# Patient Record
Sex: Male | Born: 1940 | Race: White | Hispanic: No | Smoking: Former smoker
Health system: Southern US, Academic
[De-identification: ages and names within clinical notes are randomized; demographics above are authoritative.]

## PROBLEM LIST (undated history)

## (undated) DIAGNOSIS — M069 Rheumatoid arthritis, unspecified: Secondary | ICD-10-CM

## (undated) DIAGNOSIS — M5430 Sciatica, unspecified side: Secondary | ICD-10-CM

## (undated) DIAGNOSIS — I10 Essential (primary) hypertension: Secondary | ICD-10-CM

## (undated) HISTORY — DX: Sciatica, unspecified side: M54.30

## (undated) HISTORY — PX: LEG SURGERY: SHX1003

## (undated) HISTORY — PX: INSERT / REPLACE / REMOVE PACEMAKER: SHX2821

## (undated) HISTORY — PX: INJECT ANESTHETIC AGENT;GREAT OCCIPITAL NRV (AMB ONLY): 2100000923

## (undated) HISTORY — DX: Rheumatoid arthritis, unspecified (CMS HCC): M06.9

## (undated) HISTORY — PX: REPLACEMENT TOTAL KNEE: SUR1224

## (undated) HISTORY — DX: Essential (primary) hypertension: I10

## (undated) HISTORY — PX: HX BACK SURGERY: SHX140

---

## 2010-12-21 ENCOUNTER — Ambulatory Visit (HOSPITAL_COMMUNITY): Payer: Self-pay | Admitting: PULMONARY DISEASE

## 2011-01-17 ENCOUNTER — Other Ambulatory Visit: Payer: Self-pay

## 2011-07-25 ENCOUNTER — Other Ambulatory Visit: Payer: Self-pay

## 2011-07-29 LAB — HISTORICAL CYTOPATHOLOGY, NON GYN

## 2015-04-19 ENCOUNTER — Other Ambulatory Visit: Payer: Self-pay | Admitting: Orthopaedic Surgery

## 2015-04-19 DIAGNOSIS — M4800 Spinal stenosis, site unspecified: Secondary | ICD-10-CM

## 2015-04-22 ENCOUNTER — Ambulatory Visit
Admission: RE | Admit: 2015-04-22 | Discharge: 2015-04-22 | Disposition: A | Discharge status: Home / Self Care | Payer: Medicare HMO | Source: Ambulatory Visit | Attending: Orthopaedic Surgery | Admitting: Orthopaedic Surgery

## 2015-04-22 DIAGNOSIS — M4800 Spinal stenosis, site unspecified: Secondary | ICD-10-CM

## 2016-07-09 DIAGNOSIS — M5416 Radiculopathy, lumbar region: Secondary | ICD-10-CM | POA: Diagnosis not present

## 2016-07-09 DIAGNOSIS — Z5189 Encounter for other specified aftercare: Secondary | ICD-10-CM | POA: Diagnosis not present

## 2016-07-11 DIAGNOSIS — M5416 Radiculopathy, lumbar region: Secondary | ICD-10-CM | POA: Diagnosis not present

## 2016-07-11 DIAGNOSIS — Z5189 Encounter for other specified aftercare: Secondary | ICD-10-CM | POA: Diagnosis not present

## 2016-08-22 DIAGNOSIS — Z950 Presence of cardiac pacemaker: Secondary | ICD-10-CM | POA: Diagnosis not present

## 2016-10-23 DIAGNOSIS — Z79899 Other long term (current) drug therapy: Secondary | ICD-10-CM | POA: Diagnosis not present

## 2016-10-23 DIAGNOSIS — M545 Low back pain: Secondary | ICD-10-CM | POA: Diagnosis not present

## 2016-10-23 DIAGNOSIS — R7301 Impaired fasting glucose: Secondary | ICD-10-CM | POA: Diagnosis not present

## 2016-10-23 DIAGNOSIS — E785 Hyperlipidemia, unspecified: Secondary | ICD-10-CM | POA: Diagnosis not present

## 2016-10-23 DIAGNOSIS — I499 Cardiac arrhythmia, unspecified: Secondary | ICD-10-CM | POA: Diagnosis not present

## 2016-10-23 DIAGNOSIS — I10 Essential (primary) hypertension: Secondary | ICD-10-CM | POA: Diagnosis not present

## 2016-10-23 DIAGNOSIS — Z6832 Body mass index (BMI) 32.0-32.9, adult: Secondary | ICD-10-CM | POA: Diagnosis not present

## 2016-10-23 DIAGNOSIS — D2262 Melanocytic nevi of left upper limb, including shoulder: Secondary | ICD-10-CM | POA: Diagnosis not present

## 2016-10-23 DIAGNOSIS — N401 Enlarged prostate with lower urinary tract symptoms: Secondary | ICD-10-CM | POA: Diagnosis not present

## 2016-10-23 DIAGNOSIS — M2550 Pain in unspecified joint: Secondary | ICD-10-CM | POA: Diagnosis not present

## 2016-11-04 DIAGNOSIS — Z1212 Encounter for screening for malignant neoplasm of rectum: Secondary | ICD-10-CM | POA: Diagnosis not present

## 2016-11-04 DIAGNOSIS — Z1211 Encounter for screening for malignant neoplasm of colon: Secondary | ICD-10-CM | POA: Diagnosis not present

## 2016-11-22 DIAGNOSIS — Z950 Presence of cardiac pacemaker: Secondary | ICD-10-CM | POA: Diagnosis not present

## 2017-01-06 DIAGNOSIS — I4891 Unspecified atrial fibrillation: Secondary | ICD-10-CM | POA: Diagnosis not present

## 2017-01-06 DIAGNOSIS — Z950 Presence of cardiac pacemaker: Secondary | ICD-10-CM | POA: Diagnosis not present

## 2017-01-06 DIAGNOSIS — Z9289 Personal history of other medical treatment: Secondary | ICD-10-CM | POA: Diagnosis not present

## 2017-01-06 DIAGNOSIS — I495 Sick sinus syndrome: Secondary | ICD-10-CM | POA: Diagnosis not present

## 2017-01-14 IMAGING — MR MR LUMBAR SPINE W/O CM
4 of 5 series · 21 of 48 positions shown · non-contrast
Comparison: None.

CLINICAL DATA: Lower back pain radiating down both legs, pain
starts at hips and radiates to knees, pt having numbness in the left
leg, symptoms have been off and on for several years and are
continuing to worsen.

EXAM:
MRI LUMBAR SPINE WITHOUT CONTRAST
TECHNIQUE: Multiplanar, multisequence MR imaging of the lumbar spine was
performed. No intravenous contrast was administered.

[Series 6: T2 · sagittal · 4.0mm · 0.73mm/px · 8 of 18 slices shown (1 of 2)]
[im 1/18]
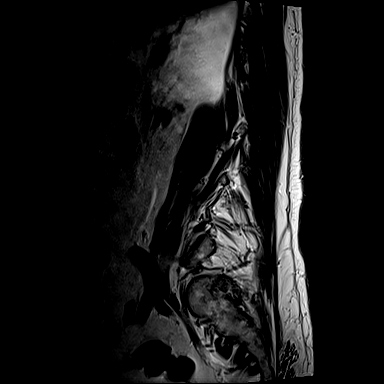
[im 3/18]
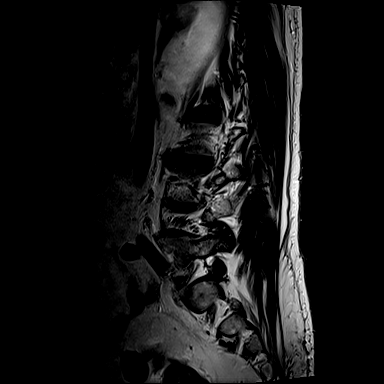
[im 5/18]
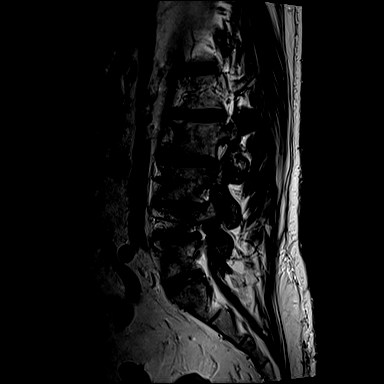
[im 8/18]
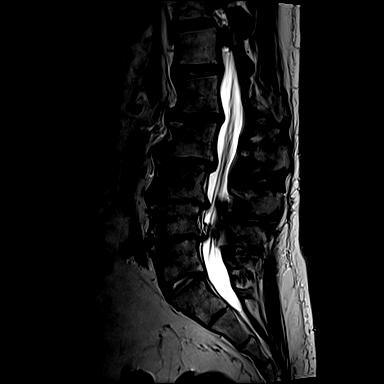
[im 10/18]
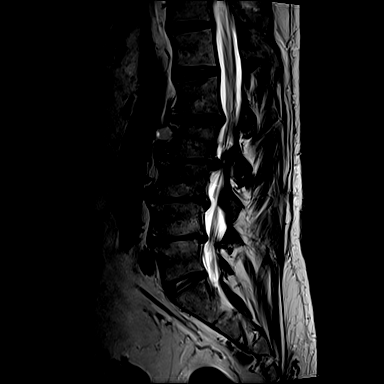
[im 13/18]
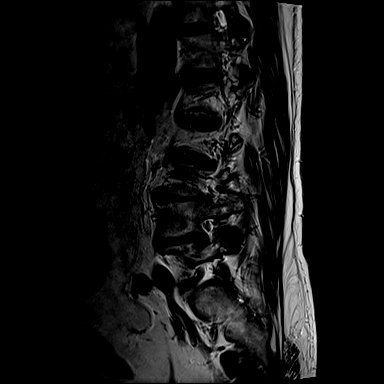
[im 15/18]
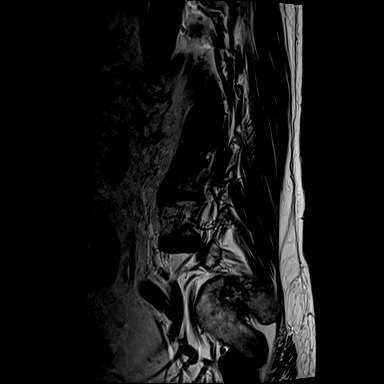
[im 18/18]
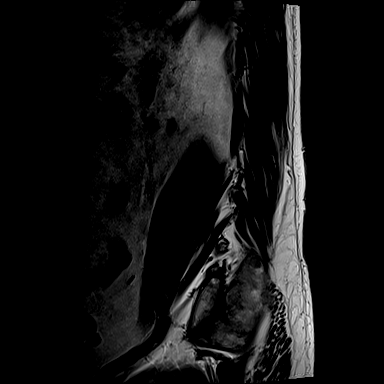

[Series 8: T1 · sagittal · 4.0mm · 0.73mm/px · 3 of 18 slices shown (1 of 2)]
[im 3/18]
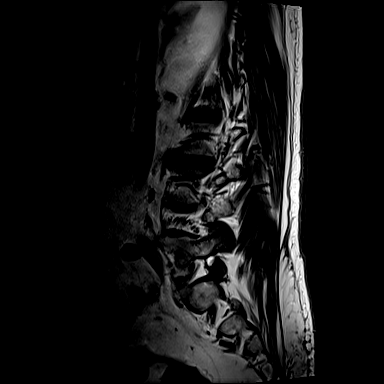
[im 9/18]
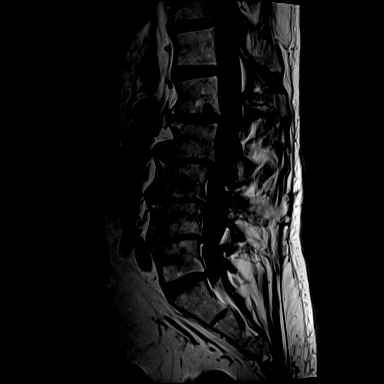
[im 15/18]
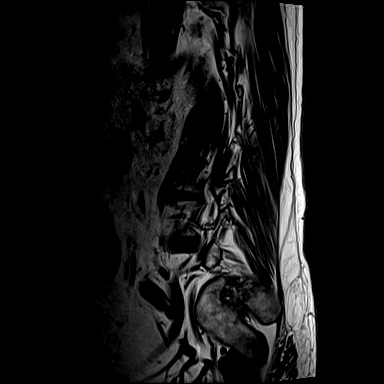

[Series 9: T2 · axial · 4.0mm · 0.28mm/px · z∈[-85,+44]mm · 7 of 31 slices shown (2 of 2)]
[im 1/31]
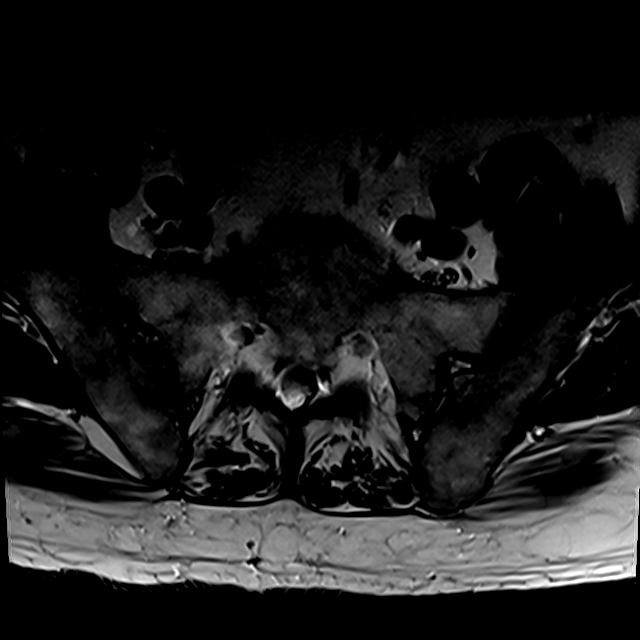
[im 6/31]
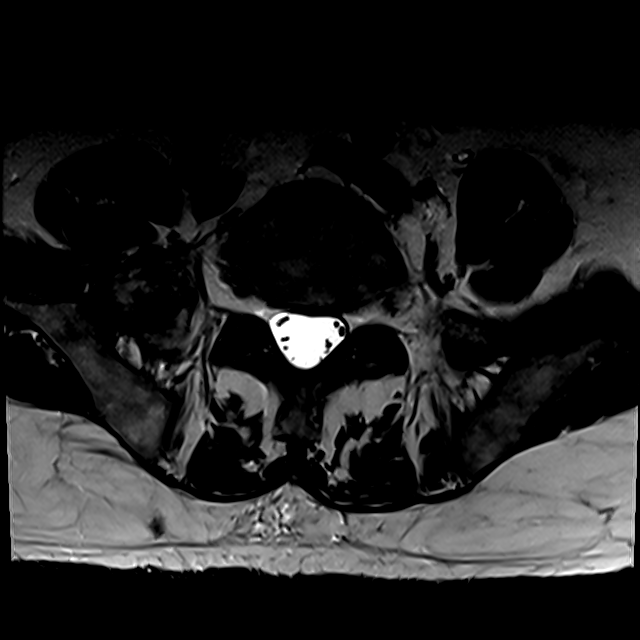
[im 11/31]
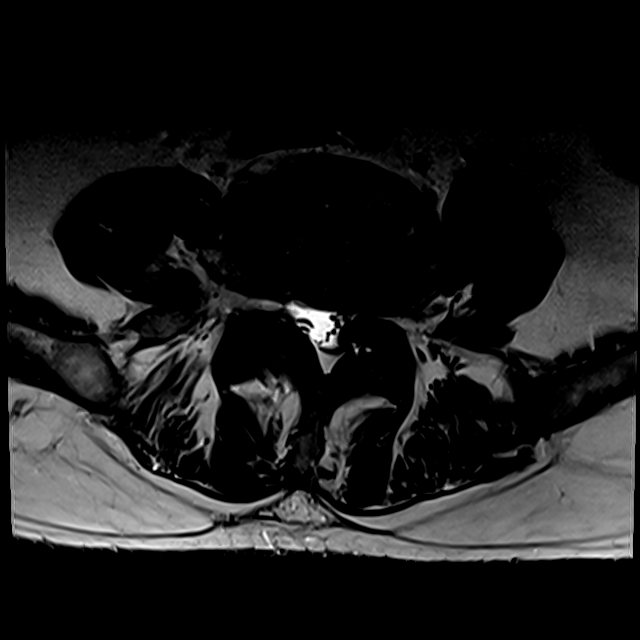
[im 13/31]
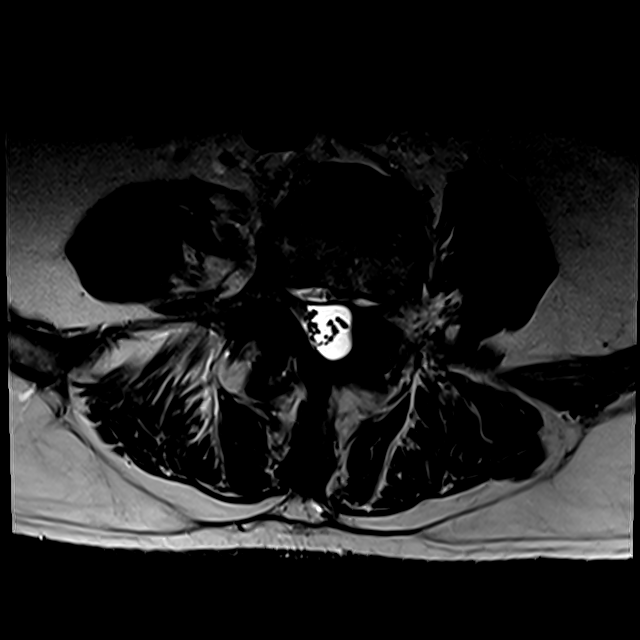
[im 16/31]
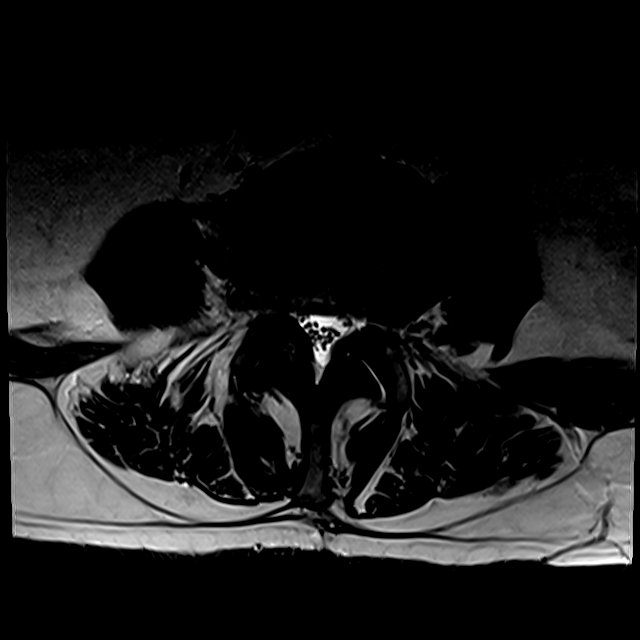
[im 18/31]
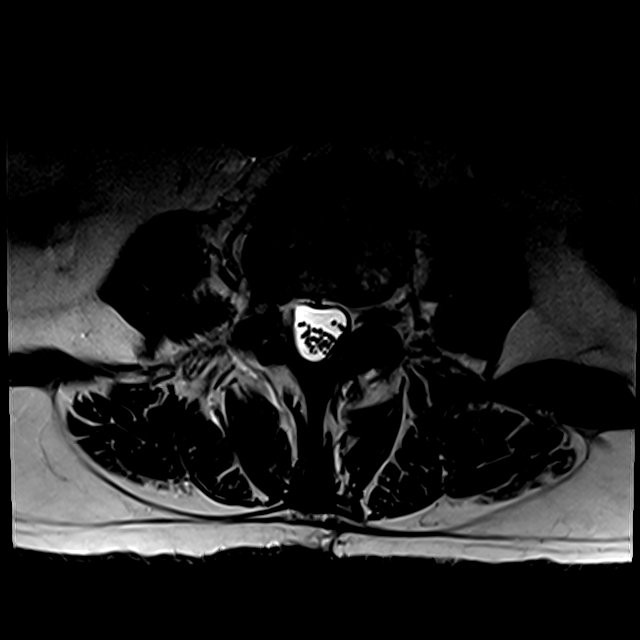
[im 26/31]
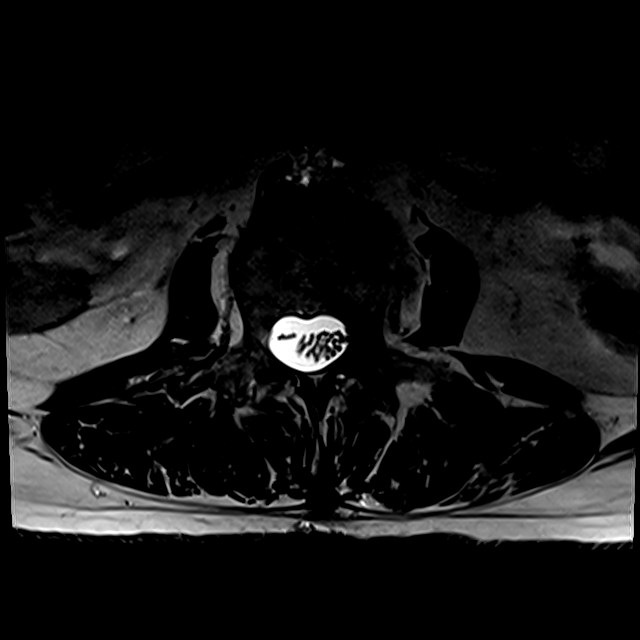

[Series 10: T1 · axial · 4.0mm · 0.56mm/px · z∈[-60,+44]mm · 3 of 31 slices shown (2 of 2)]
[im 6/31]
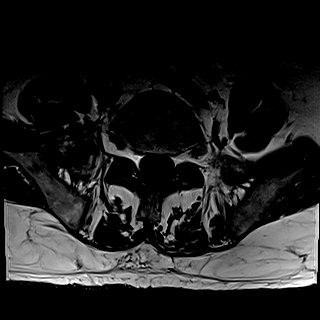
[im 16/31]
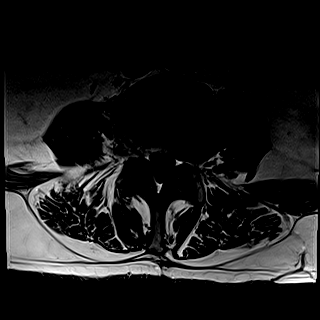
[im 26/31]
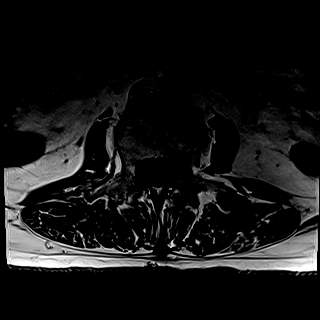

[21 of 48 positions shown; findings below may reference images not displayed]

FINDINGS: Segmentation: Normal.

Alignment: Mild straightening of the normal lumbar lordosis.
Degenerative retrolisthesis of 4 mm at L2-3, 3 mm L3-4, and 2 mm at
L4-5. Degenerative scoliosis convex RIGHT is present as well, with
asymmetric loss of interspace height at L2-3 on the LEFT.

Vertebrae: No worrisome osseous lesion.

Conus medullaris: Normal in size, signal, and location.

Paraspinal tissues: No evidence for hydronephrosis or paravertebral
mass.

Disc levels:

L1-L2: Shallow protrusion. Asymmetric LEFT subarticular zone
narrowing related to suspected mild scoliosis and asymmetric facet
arthropathy. LEFT L2 nerve root impingement is possible.

L2-L3: 4 mm retrolisthesis. Central and leftward protrusion extends
into the LEFT foramen. Asymmetric loss of interspace height on the
LEFT contributes degenerate degenerative scoliosis. Mild facet
arthropathy with mild stenosis. LEFT greater than RIGHT L3 nerve
root impingement. Possible LEFT L2 nerve root impingement in the
foramen and beyond.

L3-L4: Severe generalized loss of interspace height. 3 mm
retrolisthesis. Central disc osteophyte complex with annular
bulging. Posterior element hypertrophy. Mild stenosis. No definite
compressive subarticular zone narrowing, but BILATERAL foraminal
narrowing could affect either L3 nerve root.

L4-L5: 2 mm retrolisthesis. Central protrusion with osseous ridging.
Advanced posterior element hypertrophy. Asymmetric subarticular zone
narrowing on the LEFT. LEFT L5 nerve root impingement. BILATERAL
foraminal narrowing is worse on the RIGHT which could affect either
L4 nerve root.

L5-S1: Unremarkable disc space. Mild facet arthropathy. No
impingement.
IMPRESSION: Multilevel spondylosis as described. LEFT-sided neural impingement
could derive from multiple levels, including L2-3, L3-4, and L4-5.
Mild stenosis is worst at L2-3, multifactorial. See discussion
above.

Degenerative retrolisthesis at L2-3, L3-4, and L4-5. There is a
degree of mild degenerative scoliosis is well with asymmetric loss
of interspace height on the LEFT at L2-3. Recommend standing flexion
extension views to evaluate for dynamic instability.

## 2017-01-27 DIAGNOSIS — I482 Chronic atrial fibrillation: Secondary | ICD-10-CM | POA: Diagnosis not present

## 2017-01-27 DIAGNOSIS — M545 Low back pain: Secondary | ICD-10-CM | POA: Diagnosis not present

## 2017-01-27 DIAGNOSIS — I10 Essential (primary) hypertension: Secondary | ICD-10-CM | POA: Diagnosis not present

## 2017-01-27 DIAGNOSIS — Z23 Encounter for immunization: Secondary | ICD-10-CM | POA: Diagnosis not present

## 2017-03-31 DIAGNOSIS — Z950 Presence of cardiac pacemaker: Secondary | ICD-10-CM | POA: Diagnosis not present

## 2017-03-31 DIAGNOSIS — I4891 Unspecified atrial fibrillation: Secondary | ICD-10-CM | POA: Diagnosis not present

## 2017-03-31 DIAGNOSIS — I10 Essential (primary) hypertension: Secondary | ICD-10-CM | POA: Diagnosis not present

## 2017-03-31 DIAGNOSIS — R42 Dizziness and giddiness: Secondary | ICD-10-CM | POA: Diagnosis not present

## 2017-04-02 DIAGNOSIS — I4891 Unspecified atrial fibrillation: Secondary | ICD-10-CM | POA: Diagnosis not present

## 2017-04-02 DIAGNOSIS — I10 Essential (primary) hypertension: Secondary | ICD-10-CM | POA: Diagnosis not present

## 2017-04-14 DIAGNOSIS — Z23 Encounter for immunization: Secondary | ICD-10-CM | POA: Diagnosis not present

## 2017-04-14 DIAGNOSIS — I10 Essential (primary) hypertension: Secondary | ICD-10-CM | POA: Diagnosis not present

## 2017-04-14 DIAGNOSIS — M545 Low back pain: Secondary | ICD-10-CM | POA: Diagnosis not present

## 2017-04-14 DIAGNOSIS — Z79891 Long term (current) use of opiate analgesic: Secondary | ICD-10-CM | POA: Diagnosis not present

## 2017-04-14 DIAGNOSIS — E782 Mixed hyperlipidemia: Secondary | ICD-10-CM | POA: Diagnosis not present

## 2017-04-14 DIAGNOSIS — Z9181 History of falling: Secondary | ICD-10-CM | POA: Diagnosis not present

## 2017-04-14 DIAGNOSIS — Z7901 Long term (current) use of anticoagulants: Secondary | ICD-10-CM | POA: Diagnosis not present

## 2017-04-14 DIAGNOSIS — Z950 Presence of cardiac pacemaker: Secondary | ICD-10-CM | POA: Diagnosis not present

## 2017-04-14 DIAGNOSIS — G894 Chronic pain syndrome: Secondary | ICD-10-CM | POA: Diagnosis not present

## 2017-04-14 DIAGNOSIS — I482 Chronic atrial fibrillation: Secondary | ICD-10-CM | POA: Diagnosis not present

## 2017-04-14 DIAGNOSIS — I499 Cardiac arrhythmia, unspecified: Secondary | ICD-10-CM | POA: Diagnosis not present

## 2017-04-14 DIAGNOSIS — Z6833 Body mass index (BMI) 33.0-33.9, adult: Secondary | ICD-10-CM | POA: Diagnosis not present

## 2017-05-27 DIAGNOSIS — B356 Tinea cruris: Secondary | ICD-10-CM | POA: Diagnosis not present

## 2017-05-27 DIAGNOSIS — Z6832 Body mass index (BMI) 32.0-32.9, adult: Secondary | ICD-10-CM | POA: Diagnosis not present

## 2017-06-30 DIAGNOSIS — I495 Sick sinus syndrome: Secondary | ICD-10-CM | POA: Diagnosis not present

## 2017-06-30 DIAGNOSIS — Z950 Presence of cardiac pacemaker: Secondary | ICD-10-CM | POA: Diagnosis not present

## 2017-08-15 DIAGNOSIS — Z125 Encounter for screening for malignant neoplasm of prostate: Secondary | ICD-10-CM | POA: Diagnosis not present

## 2017-08-15 DIAGNOSIS — Z0000 Encounter for general adult medical examination without abnormal findings: Secondary | ICD-10-CM | POA: Diagnosis not present

## 2017-08-15 DIAGNOSIS — G4700 Insomnia, unspecified: Secondary | ICD-10-CM | POA: Diagnosis not present

## 2017-08-15 DIAGNOSIS — Z87891 Personal history of nicotine dependence: Secondary | ICD-10-CM | POA: Diagnosis not present

## 2017-08-15 DIAGNOSIS — M549 Dorsalgia, unspecified: Secondary | ICD-10-CM | POA: Diagnosis not present

## 2017-08-15 DIAGNOSIS — Z6831 Body mass index (BMI) 31.0-31.9, adult: Secondary | ICD-10-CM | POA: Diagnosis not present

## 2017-08-18 DIAGNOSIS — Z79899 Other long term (current) drug therapy: Secondary | ICD-10-CM | POA: Diagnosis not present

## 2017-08-18 DIAGNOSIS — Z0000 Encounter for general adult medical examination without abnormal findings: Secondary | ICD-10-CM | POA: Diagnosis not present

## 2017-08-18 DIAGNOSIS — Z125 Encounter for screening for malignant neoplasm of prostate: Secondary | ICD-10-CM | POA: Diagnosis not present

## 2017-08-18 DIAGNOSIS — M549 Dorsalgia, unspecified: Secondary | ICD-10-CM | POA: Diagnosis not present

## 2017-08-28 DIAGNOSIS — Z7901 Long term (current) use of anticoagulants: Secondary | ICD-10-CM | POA: Diagnosis not present

## 2017-08-28 DIAGNOSIS — Z6831 Body mass index (BMI) 31.0-31.9, adult: Secondary | ICD-10-CM | POA: Diagnosis not present

## 2017-08-28 DIAGNOSIS — I4891 Unspecified atrial fibrillation: Secondary | ICD-10-CM | POA: Diagnosis not present

## 2017-08-28 DIAGNOSIS — E785 Hyperlipidemia, unspecified: Secondary | ICD-10-CM | POA: Diagnosis not present

## 2017-08-28 DIAGNOSIS — Z950 Presence of cardiac pacemaker: Secondary | ICD-10-CM | POA: Diagnosis not present

## 2017-09-02 DIAGNOSIS — L814 Other melanin hyperpigmentation: Secondary | ICD-10-CM | POA: Diagnosis not present

## 2017-09-02 DIAGNOSIS — L821 Other seborrheic keratosis: Secondary | ICD-10-CM | POA: Diagnosis not present

## 2017-09-02 DIAGNOSIS — L720 Epidermal cyst: Secondary | ICD-10-CM | POA: Diagnosis not present

## 2017-09-02 DIAGNOSIS — D692 Other nonthrombocytopenic purpura: Secondary | ICD-10-CM | POA: Diagnosis not present

## 2017-09-03 DIAGNOSIS — M545 Low back pain: Secondary | ICD-10-CM | POA: Diagnosis not present

## 2017-09-03 DIAGNOSIS — M546 Pain in thoracic spine: Secondary | ICD-10-CM | POA: Diagnosis not present

## 2017-09-03 DIAGNOSIS — M5136 Other intervertebral disc degeneration, lumbar region: Secondary | ICD-10-CM | POA: Diagnosis not present

## 2017-09-03 DIAGNOSIS — M79605 Pain in left leg: Secondary | ICD-10-CM | POA: Diagnosis not present

## 2017-09-08 ENCOUNTER — Other Ambulatory Visit (HOSPITAL_BASED_OUTPATIENT_CLINIC_OR_DEPARTMENT_OTHER): Payer: Self-pay | Admitting: Family

## 2017-09-08 ENCOUNTER — Ambulatory Visit (HOSPITAL_BASED_OUTPATIENT_CLINIC_OR_DEPARTMENT_OTHER): Payer: Self-pay | Admitting: Family

## 2017-09-08 DIAGNOSIS — L729 Follicular cyst of the skin and subcutaneous tissue, unspecified: Secondary | ICD-10-CM

## 2017-09-08 NOTE — Telephone Encounter (Signed)
Please see message below.      Keng Jewel, LPN

## 2017-09-08 NOTE — Telephone Encounter (Signed)
Could you please schedule this appt?

## 2017-09-08 NOTE — Telephone Encounter (Signed)
We have cancellation for 0920 tomorrow am.

## 2017-09-08 NOTE — Telephone Encounter (Signed)
-----   Message from Fredirick MaudlinEmily G Williams sent at 09/08/2017 10:46 AM EDT -----  Tora KindredHeidi, Waymon's daughter-in-law, was calling for [facility]. Mariana KaufmanMarvin has an appointment with Rob on 11/04/17 for a cyst on his scrotum. I could get him in 10/14/17, but she said he needs seen sooner than that. Heidi was saying he can't wait that long because the cyst is seeping puss and blood and is extremely painful. She said he can hardly sit down. She is wanting to know what they should do. Please advise.    Heidi's phone number is [phone].

## 2017-09-09 ENCOUNTER — Ambulatory Visit (HOSPITAL_BASED_OUTPATIENT_CLINIC_OR_DEPARTMENT_OTHER): Payer: Medicare PPO

## 2017-09-09 ENCOUNTER — Encounter (HOSPITAL_BASED_OUTPATIENT_CLINIC_OR_DEPARTMENT_OTHER): Payer: Self-pay | Admitting: Family

## 2017-09-09 ENCOUNTER — Ambulatory Visit: Payer: Medicare PPO | Attending: Dermatology | Admitting: Family

## 2017-09-09 VITALS — BP 128/82 | Ht 69.5 in | Wt 216.0 lb

## 2017-09-09 DIAGNOSIS — L729 Follicular cyst of the skin and subcutaneous tissue, unspecified: Secondary | ICD-10-CM | POA: Diagnosis not present

## 2017-09-09 DIAGNOSIS — Z01818 Encounter for other preprocedural examination: Secondary | ICD-10-CM | POA: Diagnosis not present

## 2017-09-09 DIAGNOSIS — N5089 Other specified disorders of the male genital organs: Secondary | ICD-10-CM | POA: Diagnosis not present

## 2017-09-09 DIAGNOSIS — Z01810 Encounter for preprocedural cardiovascular examination: Secondary | ICD-10-CM

## 2017-09-09 LAB — URINALYSIS, MACROSCOPIC
BILIRUBIN: NOT DETECTED mg/dL
GLUCOSE: NOT DETECTED mg/dL
KETONES: NOT DETECTED mg/dL
NITRITE: NOT DETECTED
PH: 7 (ref 5.0–8.0)
PROTEIN: NOT DETECTED mg/dL
SPECIFIC GRAVITY: 1.01 (ref 1.005–1.030)
UROBILINOGEN: 0.2 mg/dL

## 2017-09-09 LAB — BASIC METABOLIC PANEL
ANION GAP: 7 mmol/L
BUN/CREA RATIO: 17
BUN/CREA RATIO: 17
BUN: 19 mg/dL (ref 9–21)
CALCIUM: 9.5 mg/dL (ref 8.5–10.3)
CHLORIDE: 101 mmol/L (ref 101–111)
CHLORIDE: 101 mmol/L (ref 101–111)
CO2 TOTAL: 33 mmol/L — ABNORMAL HIGH (ref 22–31)
CREATININE: 1.1 mg/dL (ref 0.70–1.40)
ESTIMATED GFR: 65 mL/min/1.73mˆ2 (ref >=60)
GLUCOSE: 96 mg/dL (ref 68–99)
POTASSIUM: 4.5 mmol/L (ref 3.6–5.0)
SODIUM: 141 mmol/L (ref 137–145)

## 2017-09-09 LAB — CBC
HCT: 42.6 % (ref 42.0–52.0)
HGB: 14.4 g/dL (ref 14.0–18.0)
MCH: 32.2 pg — ABNORMAL HIGH (ref 27.0–31.0)
MCHC: 33.8 g/dL (ref 33.0–37.0)
MCV: 95.2 fL — ABNORMAL HIGH (ref 80.0–94.0)
PLATELETS: 232 10*3/uL (ref 130–400)
RBC: 4.48 10*6/uL — ABNORMAL LOW (ref 4.70–6.10)
RDW: 15.5 % — ABNORMAL HIGH (ref 11.5–14.5)
WBC: 13.6 x10ˆ3/uL — ABNORMAL HIGH (ref 4.8–10.8)

## 2017-09-09 LAB — URINALYSIS, MICROSCOPIC: RBCS: 5 /hpf (ref 0–5)

## 2017-09-09 MED ORDER — DOXYCYCLINE HYCLATE 100 MG CAPSULE
100.00 mg | ORAL_CAPSULE | Freq: Two times a day (BID) | ORAL | 0 refills | Status: AC
Start: 2017-09-09 — End: 2017-09-19

## 2017-09-09 NOTE — Nursing Note (Signed)
Left message with Dr. Junie SpencerKukielka's nurse Karoline Caldwell(Angie) regarding pre-op clearance and  holding Eliquis 4 days prior.  [facility].  Pre-op instructions explained and given to patient.  Patient voiced understanding.  -- Jodelle GrossShannon Geovani Tootle, MA

## 2017-09-09 NOTE — H&P (Signed)
[facility]  [address]  [place] [address]  [phone]    Date: 09/09/2017  Name: Travis Allison  Age: 77 y.o.      Chief of Complaint:   Chief Complaint   Patient presents with   . New Patient     Pt presents to est care with new dr   . Cyst     Pt was referred to this office for cyst on scrotum - states he had it lanced many yrs ago and it went away. States it has gotten worse since he started taking eliquis x44yrs       Travis Allison is a 77 y.o. male  Who presents to the practice today for a new visit DR Mylasia Vorhees    PATIENT REFERRED FOR PERINEAL CYST. PT STATES GEN SURGERY [facility] DR Modesto Charon MANY YRS AGO PERFORMED AN I&D D/T INFECTION. STATES DID WELL FOR SEVERAL YEARS INTO THE LAST YEAR.  PATIENT STATES OVER LAST YR HE HAS HAD INT IRRITATION AND THIS WILL DRAIN CLEAR FLUID INT. DENIES ANY SIG DISCOMFORT TODAY. PT HAS PACEMAKER SEES [facility]. HX IRREGULAR HEART RHYTHM AND HAS BEEN ON ELIQUIS FOR THE PAST 3 YEARS.       Current health problems  Patient Active Problem List    Diagnosis Date Noted   . Cyst of scrotum 09/09/2017       ALLERGIES:  Allergies   Allergen Reactions   . Iodine        Current Outpatient Medications   Medication Sig   . cetirizine (ZYRTEC) 10 mg Oral Tablet TAKE 1 TABLET (10 MG TOTAL) BY MOUTH DAILY.   . clotrimazole-betamethasone (LOTRISONE) 1-0.05 % Cream APPLY TOPICALLY AS DIRECTED 2 TIMES A DAY   . cyanocobalamin (VITAMIN B 12) 1,000 mcg Oral Tablet Take 1,000 mcg by mouth   . dilTIAZem (CARDIZEM CD) 120 mg Oral Capsule, Sust. Release 24 hr Take 1 Cap by mouth   . doxycycline hyclate (VIBRAMYCIN) 100 mg Oral Capsule Take 1 Cap (100 mg total) by mouth Twice daily for 10 days   . ELIQUIS 5 mg Oral Tablet TAKE 1 TABLET BY MOUTH TWICE A DAY   . flecainide (TAMBOCOR) 50 mg Oral Tablet TAKE 1/2 TABLET BY MOUTH EVERY 12 HOURS   . hydroCHLOROthiazide (HYDRODIURIL) 25 mg Oral Tablet TAKE 1 TABLET BY MOUTH EVERY DAY   . losartan (COZAAR) 100 mg Oral  Tablet    . metoprolol succinate (TOPROL-XL) 50 mg Oral Tablet Sustained Release 24 hr TAKE 1 TABLET BY MOUTH EVERY DAY   . Ranitidine HCl 300 mg Oral Capsule TAKE 1 CAPSULE (300 MG) BY ORAL ROUTE ONCE DAILY AT BEDTIME FOR 90 DAYS         Last lipid profile and glucose:  No results found for: CHOLESTEROL, HDLCHOL, LDLCHOL, TRIG, GLUCOSEFAST     Past Medical History:   Diagnosis Date   . Hypertension    . Rheumatoid arthritis (CMS HCC)    . Sciatica          Past Surgical History:   Procedure Laterality Date   . HX BACK SURGERY     . INJECT ANESTHETIC AGENT;[facility] (AMB ONLY)     . INSERT / REPLACE / REMOVE PACEMAKER     . LEG SURGERY     . REPLACEMENT TOTAL KNEE      right knee           Outpatient Medications Prior to Visit:  cetirizine (  ZYRTEC) 10 mg Oral Tablet TAKE 1 TABLET (10 MG TOTAL) BY MOUTH DAILY.   clotrimazole-betamethasone (LOTRISONE) 1-0.05 % Cream APPLY TOPICALLY AS DIRECTED 2 TIMES A DAY   cyanocobalamin (VITAMIN B 12) 1,000 mcg Oral Tablet Take 1,000 mcg by mouth   dilTIAZem (CARDIZEM CD) 120 mg Oral Capsule, Sust. Release 24 hr Take 1 Cap by mouth   ELIQUIS 5 mg Oral Tablet TAKE 1 TABLET BY MOUTH TWICE A DAY   flecainide (TAMBOCOR) 50 mg Oral Tablet TAKE 1/2 TABLET BY MOUTH EVERY 12 HOURS   hydroCHLOROthiazide (HYDRODIURIL) 25 mg Oral Tablet TAKE 1 TABLET BY MOUTH EVERY DAY   losartan (COZAAR) 100 mg Oral Tablet    metoprolol succinate (TOPROL-XL) 50 mg Oral Tablet Sustained Release 24 hr TAKE 1 TABLET BY MOUTH EVERY DAY   Ranitidine HCl 300 mg Oral Capsule TAKE 1 CAPSULE (300 MG) BY ORAL ROUTE ONCE DAILY AT BEDTIME FOR 90 DAYS     No facility-administered medications prior to visit.    Family Medical History:     None            Social History     Socioeconomic History   . Marital status: Widowed     Spouse name: Not on file   . Number of children: Not on file   . Years of education: Not on file   . Highest education level: Not on file   Occupational History   . Not on file   Social  Needs   . Financial resource strain: Not on file   . Food insecurity:     Worry: Not on file     Inability: Not on file   . Transportation needs:     Medical: Not on file     Non-medical: Not on file   Tobacco Use   . Smoking status: Former Games developermoker   . Smokeless tobacco: Never Used   Substance and Sexual Activity   . Alcohol use: Not on file   . Drug use: Not on file   . Sexual activity: Not on file   Lifestyle   . Physical activity:     Days per week: Not on file     Minutes per session: Not on file   . Stress: Not on file   Relationships   . Social connections:     Talks on phone: Not on file     Gets together: Not on file     Attends religious service: Not on file     Active member of club or organization: Not on file     Attends meetings of clubs or organizations: Not on file     Relationship status: Not on file   . Intimate partner violence:     Fear of current or ex partner: Not on file     Emotionally abused: Not on file     Physically abused: Not on file     Forced sexual activity: Not on file   Other Topics Concern   . Not on file   Social History Narrative   . Not on file        There is no immunization history on file for this patient.    ROS:   GENERAL: NOT PRESENT- CHILLS, FEVER  RESPIRATORY: NOT PRESENT- DIFFICULTY BREATHING OR DYSPNEA  CARDIOVASCULAR: PRESENT- IRREGULAR HEART RATE  GI: NOT PRESENT: ABDOMINAL PAIN, NAUSEA, VOMITING  GU: SEE HPI  NEUROLOGICAL: NOT PRESENT- SEIZURES AND STROKE  ENDOCRINE: NOT PRESENT- DIABETES AND THYROID  PROBLEMS  HEMATOLOGY:  PRESENT- ELIQUIS    OBJECTIVE:   BP 128/82   Ht 1.765 m (5' 9.5")   Wt 98 kg (216 lb)   BMI 31.44 kg/m        Body mass index is 31.44 kg/m.   Exam:   GENERAL: ALERT, COOPERATIVE, WELL GROOMED, NOT IN ACUTE DISTRESS, WELL NOURISHED AND WELL DEVELOPED, VOICE NORMAL, NORMAL POSTURE  HEAD AND NECK: NORMOCEPHALIC, ATRAUMATIC WITH NO LESIONS OR PALPABLE MASSES  CHEST AND LUNG EXAM: QUIET, EVEN, AND EASY RESPIRATORY EFFORT WITH NO USE OF ACCESSORY  MUSCLES AND ON AUSCULTATION, NORMAL BREATH SOUNDS, NO ADVENTITIOUS SOUNDS  CARDIOVASCULAR: NORMAL HEART SOUNDS, REGULAR RARE AND RHYTHM WITH NO MURMURS  ABDOMEN: NON TENDER PALPATION, ABDOMINAL MASS NOT PALPABLE, LEFT KIDNEY IS NON TENDER, RIGHT KIDNEY NON TENDER, NO CVA TENDERNESS LEFT, NO CVA TENDERNESS RIGHT  GU: PENIS: NORMAL, SCROTUM: NORMAL, RIGHT TESTES: NORMAL, LEFT TESTES: NORMAL, MULT PERINEAL ABSCESSES NOTED, DIFFUSE INDURATION NOTED.  NO DRAINAGE NOTED.  NEUROLOGIC: AFFECT- NORMAL, SPEECH- NORMAL, GAIT- NORMAL  NEUROPSYCHIATRIC: ORIENTED X3, PATIENTS MOOD AND AFFECT IS NORMAL  MUSCULOSKELETAL: NO DIGITAL CLUBBING OR CYANOSIS           ASSESSMENT & PLAN:     ICD-10-CM    1. 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 Preop testing Z01.818 EKG (AMB ONLY)     CBC     BASIC METABOLIC PANEL     Orders Placed This Encounter   . CBC   . BASIC METABOLIC PANEL   . EKG (AMB ONLY)   . doxycycline hyclate (VIBRAMYCIN) 100 mg Oral Capsule     SCHEDULE EXCISION PERINEAL ABSCESSES--GEN. LABS/EKG TODAY. NEED CARDIAC CLEARANCE AND OK TO HOLD ELIQUIS 4 DAYS PRIOR. CIPRO 400MG  IV PREOP. WE WILL TX ABX COURSE 10 DAYS PRIOR TO SURGERY AND PT WILL NEED TO SEE DR Ambrielle Kington 4 DAYS PRIOR TO SURGERY TO REEVALUATE.   Treatment options, expected benefits and risks were explained to the patient including, but not limited to bleeding, infection, scar tissue formation, pain, possible need for additional therapy, anesthetic complications, heart problems, pulmonary problems, and death. Questions were addressed.    No follow-ups on file.         Terrial Rhodes, APRN  09/09/2017, 08:46

## 2017-09-10 LAB — URINE CULTURE,ROUTINE: URINE CULTURE: <10000

## 2017-09-17 ENCOUNTER — Other Ambulatory Visit (HOSPITAL_BASED_OUTPATIENT_CLINIC_OR_DEPARTMENT_OTHER): Payer: Self-pay | Admitting: UROLOGY

## 2017-09-17 DIAGNOSIS — L729 Follicular cyst of the skin and subcutaneous tissue, unspecified: Secondary | ICD-10-CM

## 2017-09-22 ENCOUNTER — Ambulatory Visit: Payer: Medicare PPO | Admitting: UROLOGY

## 2017-09-22 ENCOUNTER — Encounter (HOSPITAL_BASED_OUTPATIENT_CLINIC_OR_DEPARTMENT_OTHER): Payer: Self-pay | Admitting: UROLOGY

## 2017-09-22 VITALS — BP 126/76 | Ht 69.0 in | Wt 214.6 lb

## 2017-09-22 DIAGNOSIS — L729 Follicular cyst of the skin and subcutaneous tissue, unspecified: Secondary | ICD-10-CM | POA: Diagnosis not present

## 2017-09-22 DIAGNOSIS — N5089 Other specified disorders of the male genital organs: Secondary | ICD-10-CM | POA: Diagnosis not present

## 2017-09-22 NOTE — Progress Notes (Signed)
[facility]  [address]  [place], [place] [id number]  [phone]  09/22/2017    Patient Name: Travis Allison  MRN: [id number]  CSN: [id number]  Date of Birth:02/09/1941    Encounter Provider: Rolla Plateavid C Elynore Dolinski, MD  Referring Provider: No ref. provider found    History of Present Illness:   Travis Allison is a 77 y.o. year-old male who presents with perineal infection/possible fistula.  He was seen approximately 2 weeks ago placed on doxycycline which is significantly improved the inflammation induration around his perineum however he has developed a yeast infection of his penis and groins.  He is taking anti fungal powder which seems to be helping.  He was scheduled surgery this Friday.    Allergies   Allergen Reactions   . Iodine      Past Surgical History:   Procedure Laterality Date   . HX BACK SURGERY     . INJECT ANESTHETIC AGENT;[facility] (AMB ONLY)     . INSERT / REPLACE / REMOVE PACEMAKER     . LEG SURGERY     . REPLACEMENT TOTAL KNEE      right knee       Past Medical History:   Diagnosis Date   . Hypertension    . Rheumatoid arthritis (CMS HCC)    . Sciatica           Current Outpatient Medications   Medication Sig Dispense Refill   . cyanocobalamin (VITAMIN B 12) 1,000 mcg Oral Tablet Take 1,000 mcg by mouth     . ELIQUIS 5 mg Oral Tablet TAKE 1 TABLET BY MOUTH TWICE A DAY  2   . flecainide (TAMBOCOR) 50 mg Oral Tablet TAKE 1/2 TABLET BY MOUTH EVERY 12 HOURS  3   . hydroCHLOROthiazide (HYDRODIURIL) 25 mg Oral Tablet TAKE 1 TABLET BY MOUTH EVERY DAY  3   . losartan (COZAAR) 100 mg Oral Tablet      . metoprolol succinate (TOPROL-XL) 50 mg Oral Tablet Sustained Release 24 hr TAKE 1 TABLET BY MOUTH EVERY DAY  3   . Ranitidine HCl 300 mg Oral Capsule TAKE 1 CAPSULE (300 MG) BY ORAL ROUTE ONCE DAILY AT BEDTIME FOR 90 DAYS  3     No current facility-administered medications for this visit.      Social History     Socioeconomic History   . Marital status: Widowed     Spouse name: Not on file   . Number of  children: Not on file   . Years of education: Not on file   . Highest education level: Not on file   Occupational History   . Not on file   Social Needs   . Financial resource strain: Not on file   . Food insecurity:     Worry: Not on file     Inability: Not on file   . Transportation needs:     Medical: Not on file     Non-medical: Not on file   Tobacco Use   . Smoking status: Former Games developermoker   . Smokeless tobacco: Never Used   Substance and Sexual Activity   . Alcohol use: Not on file   . Drug use: Not on file   . Sexual activity: Not on file   Lifestyle   . Physical activity:     Days per week: Not on file     Minutes per session: Not on file   . Stress: Not on file  Relationships   . Social connections:     Talks on phone: Not on file     Gets together: Not on file     Attends religious service: Not on file     Active member of club or organization: Not on file     Attends meetings of clubs or organizations: Not on file     Relationship status: Not on file   . Intimate partner violence:     Fear of current or ex partner: Not on file     Emotionally abused: Not on file     Physically abused: Not on file     Forced sexual activity: Not on file   Other Topics Concern   . Not on file   Social History Narrative   . Not on file     Family Medical History:     None             Review of Systems:   In general:  No fever or chills  GI:  As mentioned in the HPI  GU:  As mentioned in HPI  Hematological:  On Eliquis      Urinalysis:    Lab Results   Component Value Date/Time    COLOR Yellow 09/09/2017 08:38 AM    SPECGRAVUR 1.010 09/09/2017 08:38 AM    PHURINE 7.0 09/09/2017 08:38 AM    PROTEIN Not Detected 09/09/2017 08:38 AM    GLUCOSE Not Detected 09/09/2017 08:38 AM    KETONES Not Detected 09/09/2017 08:38 AM    UROBILINOGEN 0.2  09/09/2017 08:38 AM    LEUKOCYTES 3+ (A) 09/09/2017 08:38 AM    NITRITE Not Detected 09/09/2017 08:38 AM    WBC 13.6 (H) 09/09/2017 10:15 AM    RBC 4.48 (L) 09/09/2017 10:15 AM    BACTERIA 1+  (A) 09/09/2017 08:38 AM    BILIRUBIN Not Detected 09/09/2017 08:38 AM    HGBURINE 1+ (A) 09/09/2017 08:38 AM    SQUAEPIT Rare (A) 09/09/2017 08:38 AM       Physical Exam:   In general is a white male stated age in no acute distress  Genitalia exam reveals some edema of the phallus as well as erythema of the phallus scrotum and groins consistent with a yeast infection.  Examination of his perineum reveals what appears to be a fistula tract that actually extends to the anus.    Impression:     ICD-10-CM    1. 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  Patient continue topical medication for his yeast of does not get better consider Diflucan.    This note may have been fully or partially generated using MModal Fluency Direct system, and there may be some incorrect words, spellings, and punctuation that were not noted in checking the note before saving.    Rolla Plate, MD

## 2017-09-22 NOTE — Nursing Note (Signed)
[facility]17 with Talbert ForestShirley [facility].  [facility]. -- Jodelle GrossShannon Brandie Lopes, MA

## 2017-09-23 ENCOUNTER — Ambulatory Visit (HOSPITAL_COMMUNITY): Admission: RE | Admit: 2017-09-23 | Payer: Medicare Other | Source: Ambulatory Visit

## 2017-09-23 DIAGNOSIS — L03211 Cellulitis of face: Secondary | ICD-10-CM | POA: Diagnosis not present

## 2017-09-23 DIAGNOSIS — B372 Candidiasis of skin and nail: Secondary | ICD-10-CM | POA: Diagnosis not present

## 2017-09-23 DIAGNOSIS — H6092 Unspecified otitis externa, left ear: Secondary | ICD-10-CM | POA: Diagnosis not present

## 2017-09-23 DIAGNOSIS — N5082 Scrotal pain: Secondary | ICD-10-CM | POA: Diagnosis not present

## 2017-09-26 ENCOUNTER — Ambulatory Visit (HOSPITAL_COMMUNITY): Admission: RE | Admit: 2017-09-26 | Payer: Medicare Other | Source: Ambulatory Visit | Admitting: UROLOGY

## 2017-09-26 ENCOUNTER — Encounter (HOSPITAL_COMMUNITY): Admission: RE | Payer: Self-pay | Source: Ambulatory Visit

## 2017-09-26 SURGERY — EXCISION LESION
Anesthesia: General

## 2017-09-29 DIAGNOSIS — I495 Sick sinus syndrome: Secondary | ICD-10-CM | POA: Diagnosis not present

## 2017-09-29 DIAGNOSIS — Z950 Presence of cardiac pacemaker: Secondary | ICD-10-CM | POA: Diagnosis not present

## 2017-11-04 ENCOUNTER — Encounter (HOSPITAL_BASED_OUTPATIENT_CLINIC_OR_DEPARTMENT_OTHER): Payer: Self-pay | Admitting: Family

## 2017-11-04 ENCOUNTER — Encounter (HOSPITAL_BASED_OUTPATIENT_CLINIC_OR_DEPARTMENT_OTHER): Payer: Self-pay

## 2017-11-06 DIAGNOSIS — Z950 Presence of cardiac pacemaker: Secondary | ICD-10-CM | POA: Diagnosis not present

## 2017-11-06 DIAGNOSIS — I471 Supraventricular tachycardia: Secondary | ICD-10-CM | POA: Diagnosis not present

## 2017-11-06 DIAGNOSIS — I4891 Unspecified atrial fibrillation: Secondary | ICD-10-CM | POA: Diagnosis not present

## 2017-11-06 DIAGNOSIS — L03211 Cellulitis of face: Secondary | ICD-10-CM | POA: Diagnosis not present

## 2017-11-18 DIAGNOSIS — I480 Paroxysmal atrial fibrillation: Secondary | ICD-10-CM | POA: Diagnosis not present

## 2017-11-18 DIAGNOSIS — E785 Hyperlipidemia, unspecified: Secondary | ICD-10-CM | POA: Diagnosis not present

## 2017-11-18 DIAGNOSIS — R21 Rash and other nonspecific skin eruption: Secondary | ICD-10-CM | POA: Diagnosis not present

## 2017-11-18 DIAGNOSIS — I10 Essential (primary) hypertension: Secondary | ICD-10-CM | POA: Diagnosis not present

## 2018-01-26 DIAGNOSIS — R197 Diarrhea, unspecified: Secondary | ICD-10-CM | POA: Diagnosis not present

## 2018-03-11 DIAGNOSIS — L509 Urticaria, unspecified: Secondary | ICD-10-CM | POA: Diagnosis not present

## 2018-03-15 DIAGNOSIS — I509 Heart failure, unspecified: Secondary | ICD-10-CM | POA: Diagnosis not present

## 2018-04-15 DIAGNOSIS — Z950 Presence of cardiac pacemaker: Secondary | ICD-10-CM | POA: Diagnosis not present

## 2018-04-15 DIAGNOSIS — I509 Heart failure, unspecified: Secondary | ICD-10-CM | POA: Diagnosis not present

## 2018-05-10 DIAGNOSIS — J209 Acute bronchitis, unspecified: Secondary | ICD-10-CM | POA: Diagnosis not present

## 2018-05-10 DIAGNOSIS — J0100 Acute maxillary sinusitis, unspecified: Secondary | ICD-10-CM | POA: Diagnosis not present

## 2018-05-19 DIAGNOSIS — R69 Illness, unspecified: Secondary | ICD-10-CM | POA: Diagnosis not present

## 2018-05-19 DIAGNOSIS — I480 Paroxysmal atrial fibrillation: Secondary | ICD-10-CM | POA: Diagnosis not present

## 2018-05-19 DIAGNOSIS — J181 Lobar pneumonia, unspecified organism: Secondary | ICD-10-CM | POA: Diagnosis not present

## 2018-05-25 DIAGNOSIS — I480 Paroxysmal atrial fibrillation: Secondary | ICD-10-CM | POA: Diagnosis not present

## 2018-05-26 DIAGNOSIS — R9431 Abnormal electrocardiogram [ECG] [EKG]: Secondary | ICD-10-CM | POA: Diagnosis not present

## 2018-06-18 DIAGNOSIS — I480 Paroxysmal atrial fibrillation: Secondary | ICD-10-CM | POA: Diagnosis not present

## 2018-06-18 DIAGNOSIS — I10 Essential (primary) hypertension: Secondary | ICD-10-CM | POA: Diagnosis not present

## 2018-06-18 DIAGNOSIS — Z131 Encounter for screening for diabetes mellitus: Secondary | ICD-10-CM | POA: Diagnosis not present

## 2018-06-18 DIAGNOSIS — G4700 Insomnia, unspecified: Secondary | ICD-10-CM | POA: Diagnosis not present

## 2018-06-18 DIAGNOSIS — K219 Gastro-esophageal reflux disease without esophagitis: Secondary | ICD-10-CM | POA: Diagnosis not present

## 2018-06-18 DIAGNOSIS — E782 Mixed hyperlipidemia: Secondary | ICD-10-CM | POA: Diagnosis not present

## 2018-06-19 DIAGNOSIS — Z87891 Personal history of nicotine dependence: Secondary | ICD-10-CM | POA: Diagnosis not present

## 2018-06-19 DIAGNOSIS — Z125 Encounter for screening for malignant neoplasm of prostate: Secondary | ICD-10-CM | POA: Diagnosis not present

## 2018-06-19 DIAGNOSIS — Z23 Encounter for immunization: Secondary | ICD-10-CM | POA: Diagnosis not present

## 2018-06-19 DIAGNOSIS — Z0000 Encounter for general adult medical examination without abnormal findings: Secondary | ICD-10-CM | POA: Diagnosis not present

## 2018-06-19 DIAGNOSIS — R399 Unspecified symptoms and signs involving the genitourinary system: Secondary | ICD-10-CM | POA: Diagnosis not present

## 2018-06-19 DIAGNOSIS — G4700 Insomnia, unspecified: Secondary | ICD-10-CM | POA: Diagnosis not present

## 2018-07-14 DIAGNOSIS — I509 Heart failure, unspecified: Secondary | ICD-10-CM | POA: Diagnosis not present

## 2018-07-14 DIAGNOSIS — Z950 Presence of cardiac pacemaker: Secondary | ICD-10-CM | POA: Diagnosis not present

## 2018-11-04 ENCOUNTER — Other Ambulatory Visit: Payer: Self-pay | Admitting: Rehabilitation

## 2018-11-04 DIAGNOSIS — M4326 Fusion of spine, lumbar region: Secondary | ICD-10-CM

## 2018-11-09 ENCOUNTER — Telehealth: Payer: Self-pay | Admitting: Nurse Practitioner

## 2018-11-09 NOTE — Telephone Encounter (Signed)
Phone call to patient to verify medication list and allergies for myelogram procedure. Pt aware he will need to hold Eliquis for 48hrs prior to myelogram appointment time, pending approval and further recommendation from Dr. Teressa Lower. Pt verbalized understanding. Pre and post procedure instructions reviewed with pt. Thinner hold request faxed to Dr. Garlon Hatchet, awaiting reply

## 2018-11-26 ENCOUNTER — Other Ambulatory Visit: Payer: Medicare HMO
